# Patient Record
Sex: Female | Born: 1990 | Race: Black or African American | Hispanic: No | Marital: Single
Health system: Southern US, Community
[De-identification: ages and names within clinical notes are randomized; demographics above are authoritative.]

---

## 2021-10-24 ENCOUNTER — Emergency Department: Payer: BC Managed Care – PPO

## 2021-10-24 ENCOUNTER — Emergency Department
Admission: EM | Admit: 2021-10-24 | Discharge: 2021-10-24 | Disposition: A | Discharge status: Home / Self Care | Payer: BC Managed Care – PPO | Attending: Emergency Medicine | Admitting: Emergency Medicine

## 2021-10-24 ENCOUNTER — Encounter: Payer: Self-pay | Admitting: Emergency Medicine

## 2021-10-24 ENCOUNTER — Other Ambulatory Visit: Payer: Self-pay

## 2021-10-24 DIAGNOSIS — M7989 Other specified soft tissue disorders: Secondary | ICD-10-CM | POA: Insufficient documentation

## 2021-10-24 DIAGNOSIS — M79644 Pain in right finger(s): Secondary | ICD-10-CM | POA: Diagnosis not present

## 2021-10-24 DIAGNOSIS — M79641 Pain in right hand: Secondary | ICD-10-CM | POA: Diagnosis not present

## 2021-10-24 MED ORDER — CEPHALEXIN 500 MG PO CAPS: 500.0000 mg | ORAL_CAPSULE | Freq: Four times a day (QID) | ORAL | 0 refills | Status: AC

## 2021-10-24 NOTE — ED Triage Notes (Signed)
Pt reports pain to her right hand for the past 2 days, denies obvious injuries. ?

## 2021-10-24 NOTE — ED Provider Notes (Signed)
? ?[facility] ?Provider Note ? ? ? Event Date/Time  ? First MD Initiated Contact with Patient 10/24/21 1039   ?  (approximate) ? ? ?History  ? ?Chief Complaint ?Hand Pain ? ? ?HPI ?Katherine Williamson is a 31 y.o. female, no remarkable medical history, presents to the emergency department for evaluation of right hand pain.  Patient states that she has been experiencing mild pain diffusely around her right hand, primarily along her ring finger.  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  She states that her right hand is more swollen than her left.  Denies fever/chills, chest pain, shortness of breath, abdominal pain, back pain, numbness/tingling in upper extremities, urinary symptoms, headache, fatigue, lightheadedness/dizziness, or nausea/vomiting. ? ?History Limitations: No limitations. ? ?  ? ? ?Physical Exam  ?Triage Vital Signs: ?ED Triage Vitals [10/24/21 1037]  ?Enc Vitals Group  ?   BP   ?   Pulse   ?   Resp   ?   Temp   ?   Temp src   ?   SpO2   ?   Weight 217 lb (98.4 kg)  ?   Height 5\' 9"  (1.753 m)  ?   Head Circumference   ?   Peak Flow   ?   Pain Score 10  ?   Pain Loc   ?   Pain Edu?   ?   Excl. in GC?   ? ? ?Most recent vital signs: ?Vitals:  ? 10/24/21 1041  ?BP: (!) 136/105  ?Pulse: 100  ?Resp: 18  ?Temp: 98.4 ?F (36.9 ?C)  ?SpO2: 96%  ? ? ?General: Awake, NAD.  ?Skin: Warm, dry.  ?CV: Good peripheral perfusion.  ?Resp: Normal effort.  ?Abd: Soft, non-tender. No distention.  ?Neuro: At baseline. No gross neurological deficits.  ?Other: Right hand is mildly more swollen than the left.  Patient maintains normal grip strength and flexion/extension in the phalanges, however the range of motion in the ring finger is slightly more reduced due to pain.  Reports diffuse tenderness around the affected finger.  There is mild erythema present.  No active bleeding or discharge.  Normal cap refill distally. ? ?Physical  Exam ? ? ? ?ED Results / Procedures / Treatments  ?Labs ?(all labs ordered are listed, but only abnormal results are displayed) ?Labs Reviewed - No data to display ? ? ?EKG ?Not applicable. ? ? ?RADIOLOGY ? ?ED Provider Interpretation: I personally reviewed and interpreted this x-ray, no evidence of acute findings. ? ?DG Hand Complete Right ? ?Result Date: 10/24/2021 ?CLINICAL DATA:  Right hand pain for 2 days. EXAM: RIGHT HAND - COMPLETE 3+ VIEW COMPARISON:  None. FINDINGS: Three view study. There is no evidence of fracture or dislocation. There is no evidence of arthropathy or other focal bone abnormality. Soft tissues are unremarkable. IMPRESSION: Negative. Electronically Signed   By: 12/24/2021 M.D.   On: 10/24/2021 11:32   ? ?PROCEDURES: ? ?Critical Care performed: None. ? ?Procedures ? ? ? ?MEDICATIONS ORDERED IN ED: ?Medications - No data to display ? ? ?IMPRESSION / MDM / ASSESSMENT AND PLAN / ED COURSE  ?I reviewed the triage vital signs and the nursing notes. ?             ?               ? ? ?Differential diagnosis includes, but is not limited  to, metacarpal fracture, phalangeal fracture, mallet finger, [place] finger, cellulitis, flexor tenosynovitis ? ?ED Course ?Patient appears well.  Vital signs within normal limits.  NAD ? ?An x-ray shows no acute abnormalities. ? ?Assessment/Plan ?Patient presents with hand pain/swelling, particularly prominent along the right ring finger.  X-ray reassuring for no evidence of fractures or dislocations.  Patient's  limited range of motion possibly due to soft tissue injury of the flexor/extensor tendons, however unlikely due to any identifiable traumatic incident.  The hand does not look obviously infected, however the ring finger does have mild swelling and faint erythema.  Presentation not consistent with flexor tenosynovitis, but may potentially have early cellulitis.  Will provide patient with a splint and a prescription for cephalexin.  Advised her to follow-up  with her primary care provider within the week for further evaluation..  Patient expressed understanding and agreed with the plan.  Will discharge. ? ?Patient was provided with anticipatory guidance, return precautions, and educational material. Encouraged the patient to return to the emergency department at any time if they begin to experience any new or worsening symptoms.  ? ?  ? ? ?FINAL CLINICAL IMPRESSION(S) / ED DIAGNOSES  ? ?Final diagnoses:  ?Hand pain, right  ? ? ? ?Rx / DC Orders  ? ?ED Discharge Orders   ? ?      Ordered  ?  cephALEXin (KEFLEX) 500 MG capsule  4 times daily       ? 10/24/21 1211  ? ?  ?  ? ?  ? ? ? ?Note:  This document was prepared using Dragon voice recognition software and may include unintentional dictation errors. ?  ?Varney Daily, [place] ?10/25/21 2585 ? ?  ?Georga Hacking, MD ?10/25/21 1622 ? ?

## 2021-10-24 NOTE — Discharge Instructions (Addendum)
-  Take all your antibiotics as prescribed. ?-You may treat the pain with Tylenol/ibuprofen as needed. ?-Return to the emergency department anytime if you begin to experience any new or worsening symptoms. ? ?

## 2023-04-05 IMAGING — DX DG HAND COMPLETE 3+V*R*
3 series · 3 of 3 positions shown · non-contrast
Comparison: None.

CLINICAL DATA: Right hand pain for 2 days.

EXAM:
RIGHT HAND - COMPLETE 3+ VIEW

[hand ap]
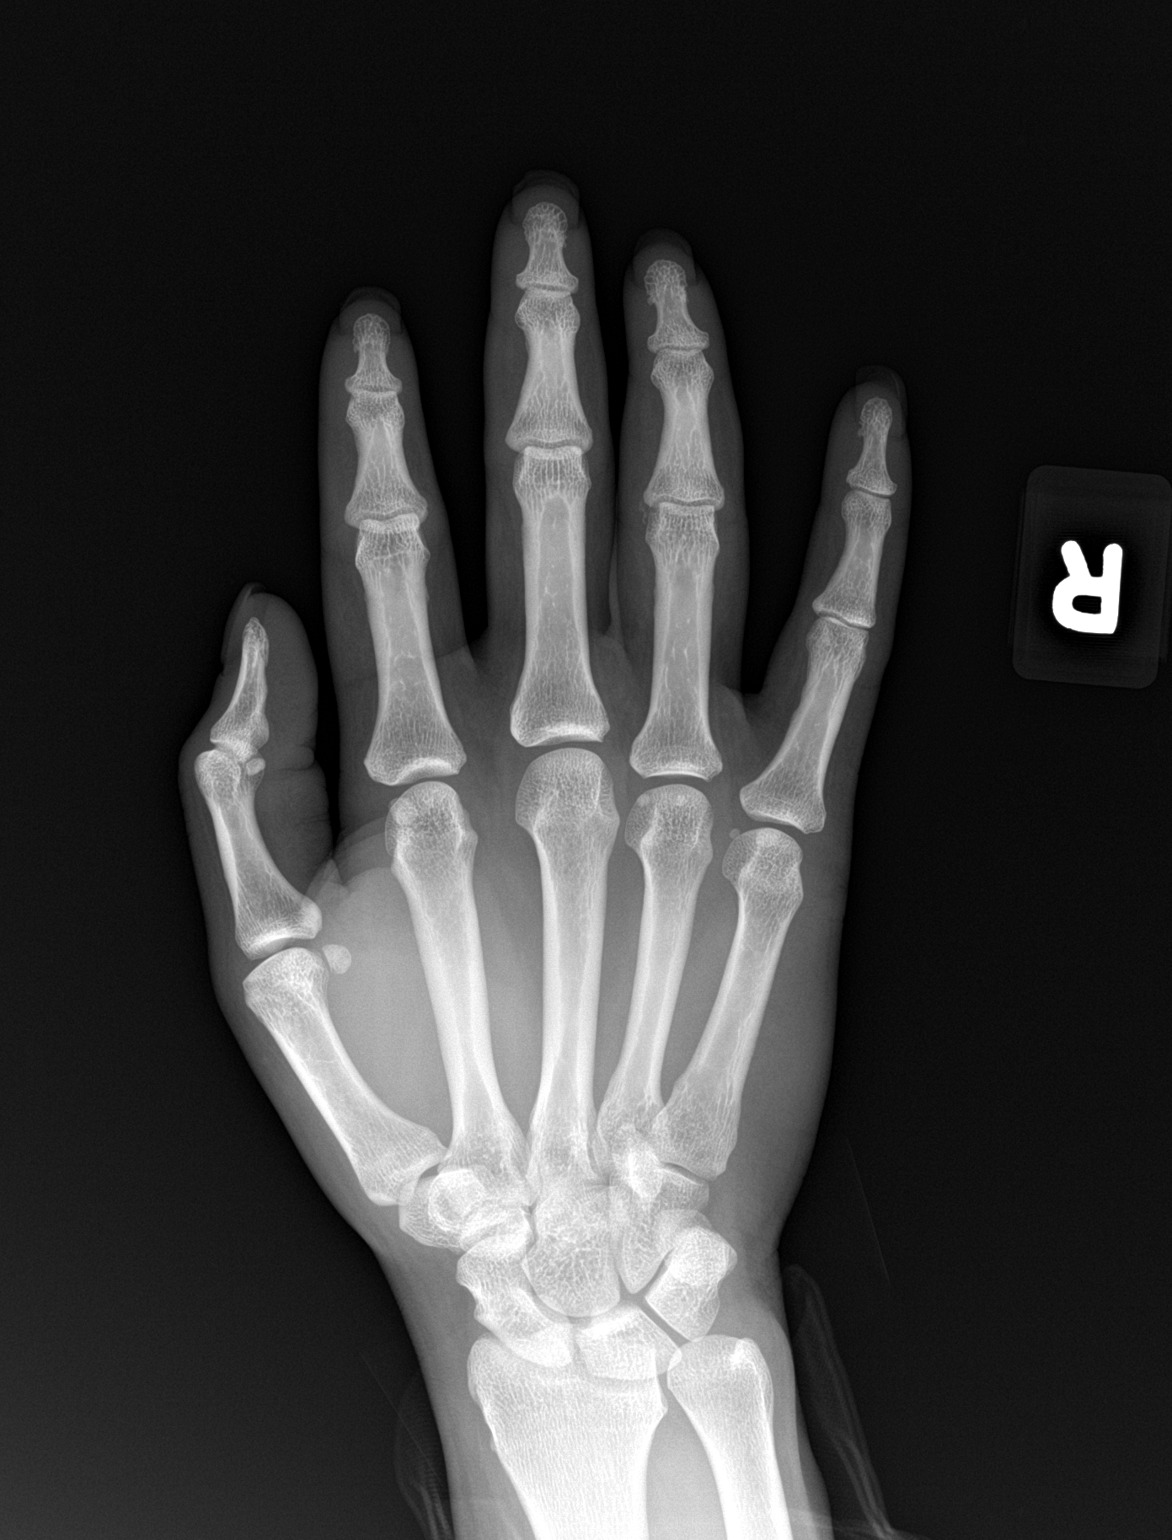

[hand obl]
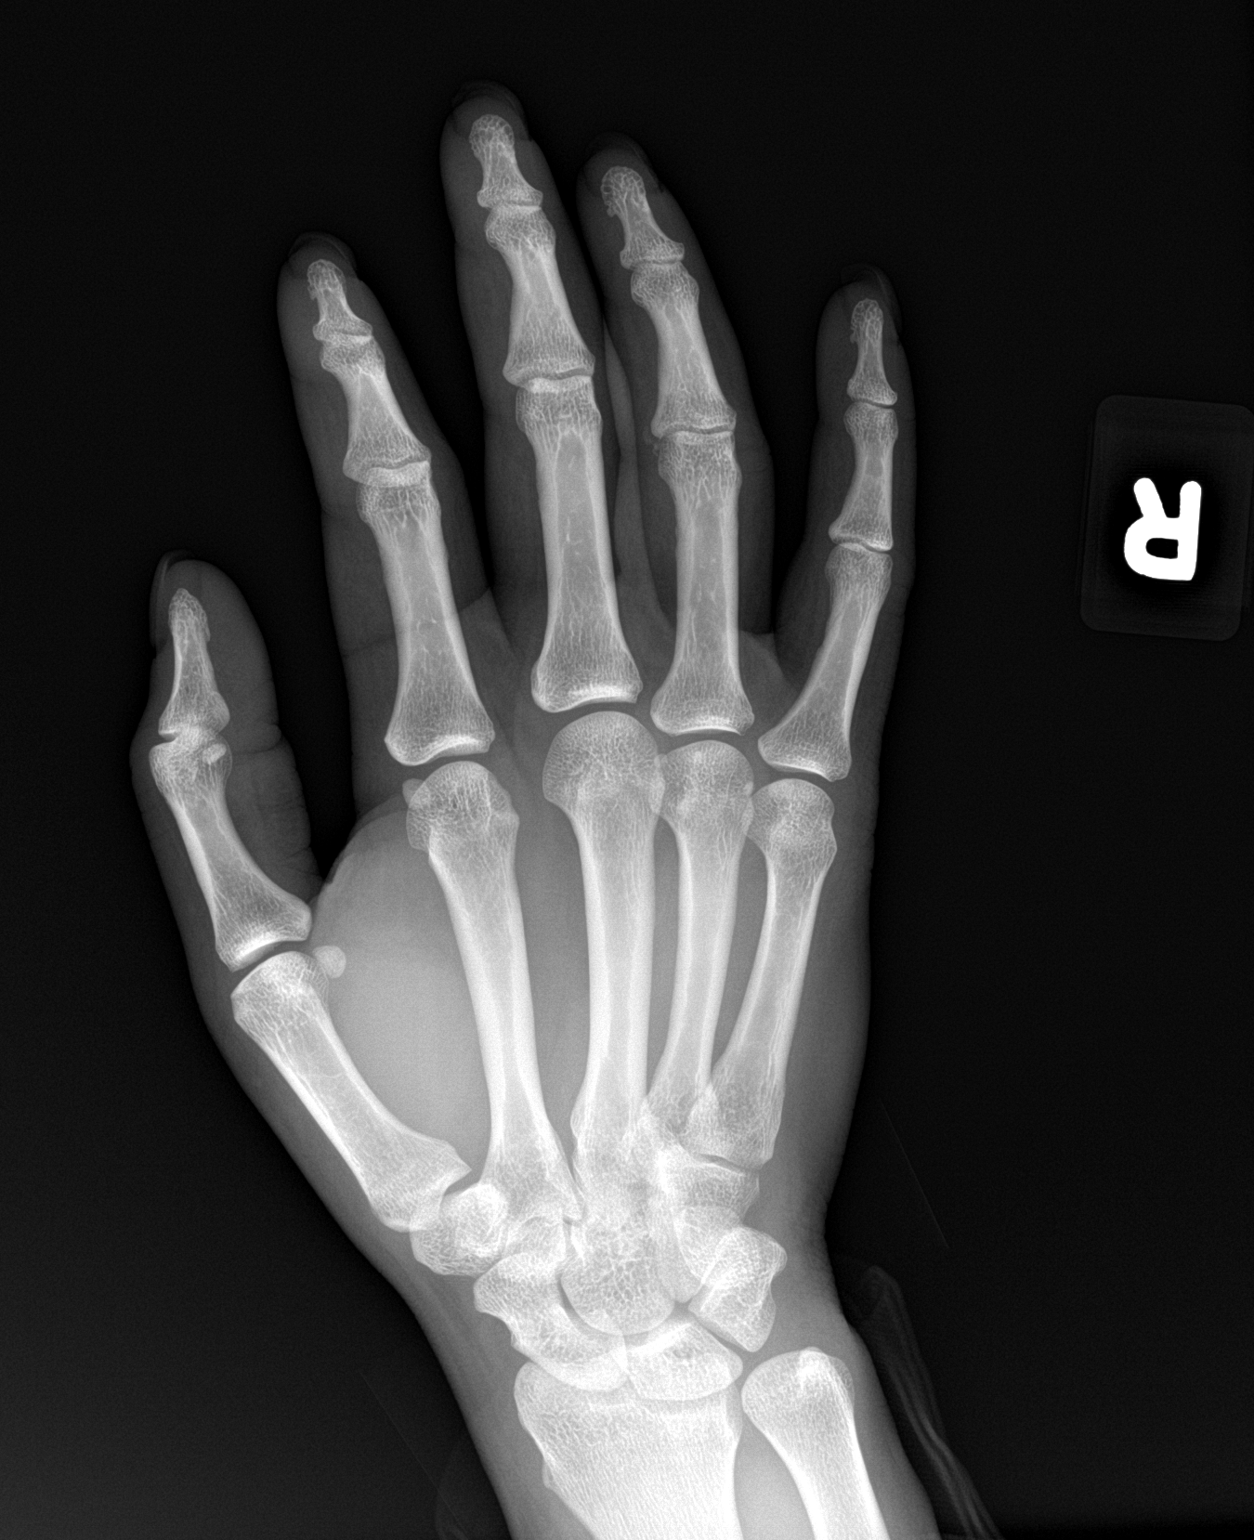

[hand lat]
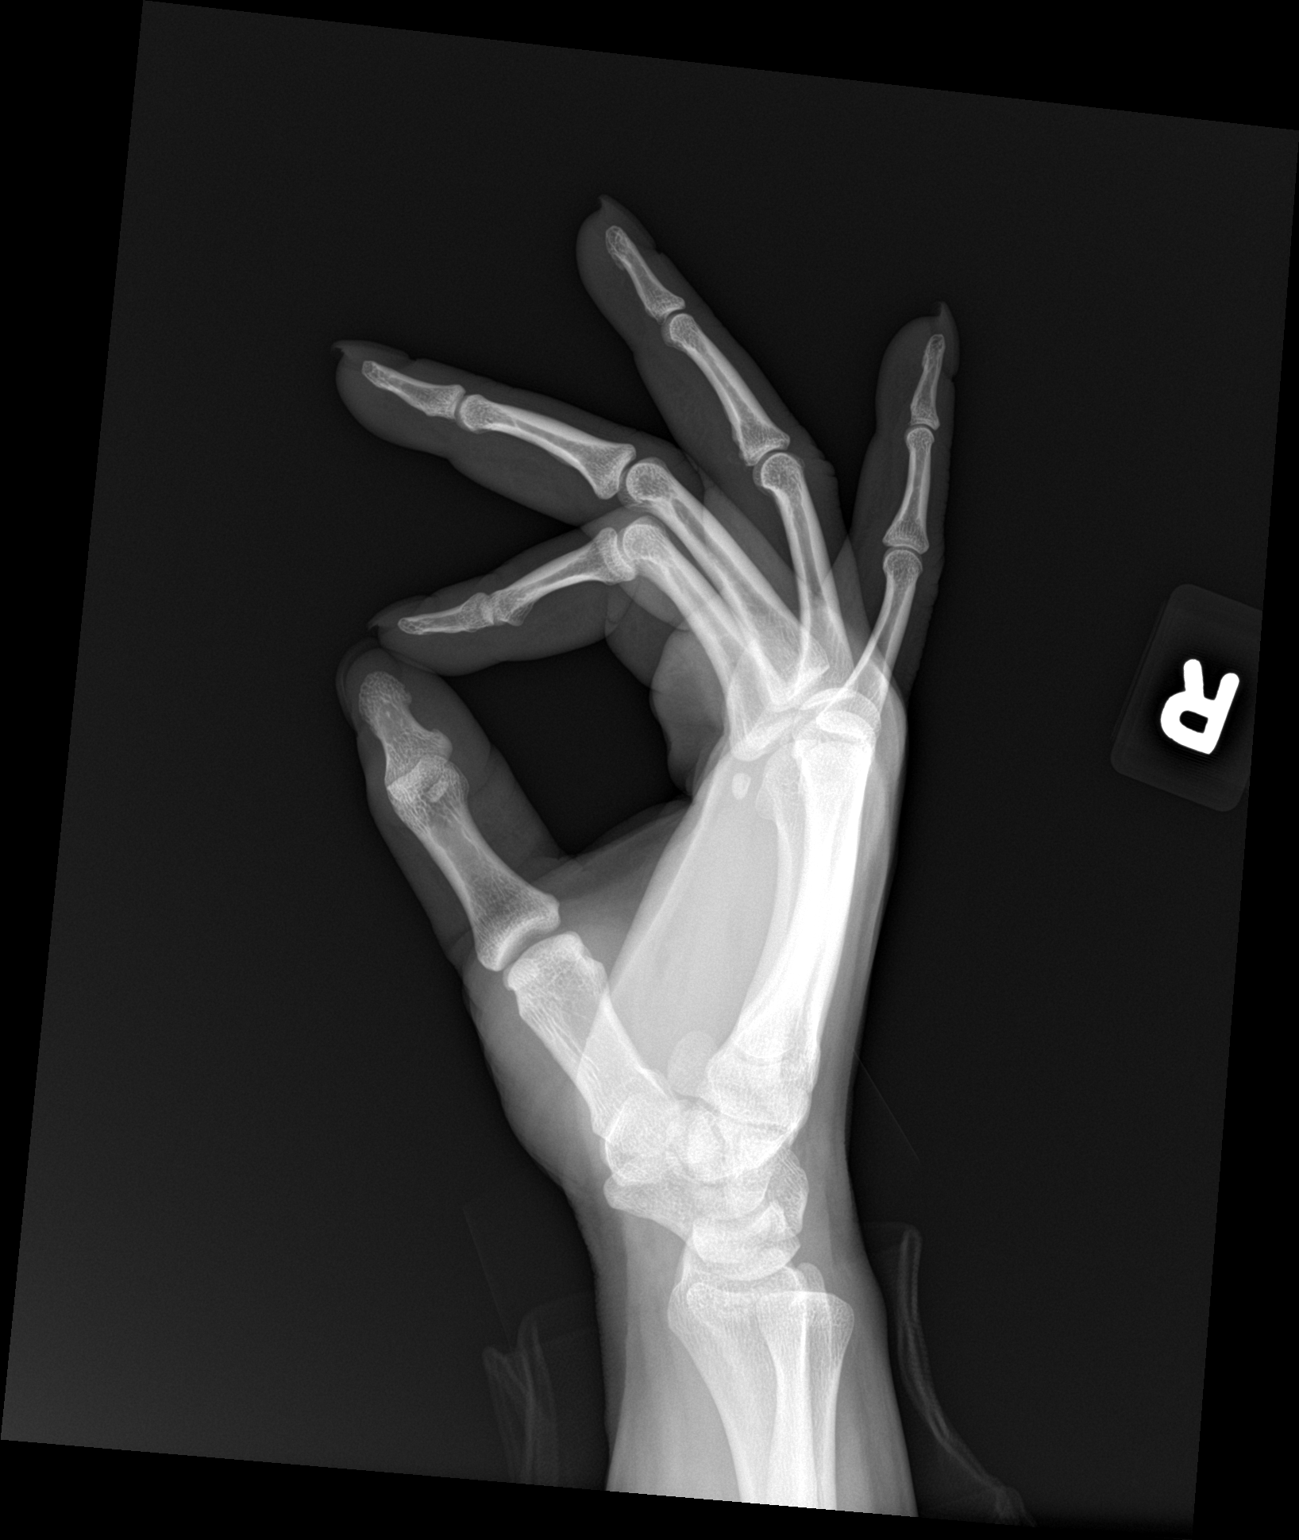

[3 of 3 positions shown; findings below may reference images not displayed]

FINDINGS: Three view study. There is no evidence of fracture or dislocation.
There is no evidence of arthropathy or other focal bone abnormality.
Soft tissues are unremarkable.
IMPRESSION: Negative.

## 2023-05-15 ENCOUNTER — Telehealth: Payer: Self-pay

## 2023-05-15 NOTE — Telephone Encounter (Signed)
Copied from CRM [id number]. Topic: Appointment Scheduling - Scheduling Inquiry for Clinic >> May 15, 2023  9:15 AM Phill Myron wrote: Katherine Williamson would like information on Dr [facility]. Please advise

## 2023-05-16 ENCOUNTER — Encounter: Payer: BC Managed Care – PPO | Admitting: Family Medicine

## 2023-08-02 ENCOUNTER — Ambulatory Visit: Payer: BC Managed Care – PPO | Admitting: Family Medicine

## 2023-09-14 ENCOUNTER — Ambulatory Visit: Payer: Self-pay | Admitting: Family Medicine

## 2024-01-16 ENCOUNTER — Ambulatory Visit (INDEPENDENT_AMBULATORY_CARE_PROVIDER_SITE_OTHER)

## 2024-01-16 ENCOUNTER — Ambulatory Visit (HOSPITAL_COMMUNITY)
Admission: EM | Admit: 2024-01-16 | Discharge: 2024-01-16 | Disposition: A | Discharge status: Home / Self Care | Attending: Emergency Medicine | Admitting: Emergency Medicine

## 2024-01-16 ENCOUNTER — Encounter (HOSPITAL_COMMUNITY): Payer: Self-pay

## 2024-01-16 DIAGNOSIS — M25552 Pain in left hip: Secondary | ICD-10-CM

## 2024-01-16 MED ORDER — CYCLOBENZAPRINE HCL 10 MG PO TABS: 10.0000 mg | ORAL_TABLET | Freq: Two times a day (BID) | ORAL | 0 refills | Status: AC | PRN

## 2024-01-16 MED ORDER — KETOROLAC TROMETHAMINE 30 MG/ML IJ SOLN
30.0000 mg | Freq: Once | INTRAMUSCULAR | Status: AC
  Administered 2024-01-16: 30 mg via INTRAMUSCULAR

## 2024-01-16 MED ORDER — PREDNISONE 20 MG PO TABS: 40.0000 mg | ORAL_TABLET | Freq: Every day | ORAL | 0 refills | Status: AC

## 2024-01-16 MED ORDER — KETOROLAC TROMETHAMINE 30 MG/ML IJ SOLN
INTRAMUSCULAR | Status: AC
Start: 2024-01-16 — End: 2024-01-16
  Filled 2024-01-16: qty 1

## 2024-01-16 NOTE — Discharge Instructions (Addendum)
 The xray does not show any abnormality of the pelvis  The Toradol injection given today should continue to provide pain relief overnight. Please do not use any NSAIDs (ibuprofen/Advil, naproxen/Aleve, etc) for the next 24 hours. You can safely use tylenol if needed.  You can take the muscle relaxer (FLEXERIL) twice daily. If the medication makes you drowsy, take only at bed time.  Starting tomorrow morning, start the prednisone burst. Take 2 tablets daily for 5 days in a row. This is a steroid for inflammation and pain  Please follow closely with orthopedics - you can call to make an appointment

## 2024-01-16 NOTE — ED Provider Notes (Signed)
 [facility]    CSN: [id number] Arrival date & time: 01/16/24  1749      History   Chief Complaint Chief Complaint  Patient presents with   Leg Pain    HPI Katherine Williamson is a 33 y.o. female.  2 day history of left hip and groin pain that radiates down the left thigh. Today rating 10/10 pain Has used tylenol and BC powder. Also tried one gabapentin last night Denies injury or trauma. No falls  In February saw [facility]. 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 MRI was done in March but she didn't get the results. Reports [facility] is very pricey and hasn't been back to see them.   History reviewed. No pertinent past medical history.  There are no active problems to display for this patient.   History reviewed. No pertinent surgical history.  OB History   No obstetric history on file.      Home Medications    Prior to Admission medications   Medication Sig Start Date End Date Taking? Authorizing Provider  cyclobenzaprine (FLEXERIL) 10 MG tablet Take 1 tablet (10 mg total) by mouth 2 (two) times daily as needed for muscle spasms. 01/16/24  Yes Katherine Williamson, Asberry, Katherine Williamson  predniSONE (DELTASONE) 20 MG tablet Take 2 tablets (40 mg total) by mouth daily with breakfast for 5 days. 01/17/24 01/22/24 Yes Carrolyn Hilmes, Katherine Williamson    Family History History reviewed. No pertinent family history.  Social History Social History   Tobacco Use   Smoking status: Never    Passive exposure: Never   Smokeless tobacco: Never  Vaping Use   Vaping status: Never Used  Substance Use Topics   Alcohol use: Not Currently   Drug use: Never     Allergies   Patient has no known allergies.   Review of Systems Review of Systems As per HPI  Physical Exam Triage Vital Signs ED Triage Vitals  Encounter Vitals Group     BP 01/16/24 1819 119/65     Girls Systolic BP Percentile --      Girls Diastolic BP Percentile --      Boys Systolic BP Percentile --      Boys Diastolic BP  Percentile --      Pulse Rate 01/16/24 1819 (!) 110     Resp 01/16/24 1819 18     Temp 01/16/24 1819 99.6 F (37.6 C)     Temp Source 01/16/24 1819 Oral     SpO2 01/16/24 1819 96 %     Weight --      Height --      Head Circumference --      Peak Flow --      Pain Score 01/16/24 1820 10     Pain Loc --      Pain Education --      Exclude from Growth Chart --    No data found.  Updated Vital Signs BP 110/75 (BP Location: Right Arm)   Pulse (!) 101   Temp 99.3 F (37.4 C) (Oral)   Resp 18   LMP 01/08/2024 (Exact Date)   SpO2 98%    Physical Exam Vitals and nursing note reviewed.  Constitutional:      General: She is not in acute distress.    Appearance: She is not ill-appearing.  HENT:     Mouth/Throat:     Pharynx: Oropharynx is clear.   Cardiovascular:     Rate and Rhythm: Normal rate and regular rhythm.  Pulses: Normal pulses.  Pulmonary:     Effort: Pulmonary effort is normal.   Musculoskeletal:        General: No deformity.     Cervical back: Normal range of motion. No rigidity.     Left lower leg: No edema.     Comments: Reduced ROM of left lower extremity at hip due to pain. No bony tenderness of C-L spine. Right leg strength 5/5, left leg 4/5. Distal sensation is intact. Normal ROM of ankles and knees, DP pulses 2+. Negative straight leg test  Lymphadenopathy:     Cervical: No cervical adenopathy.   Skin:    Capillary Refill: Capillary refill takes less than 2 seconds.   Neurological:     Mental Status: She is alert and oriented to person, place, and time.     UC Treatments / Results  Labs (all labs ordered are listed, but only abnormal results are displayed) Labs Reviewed - No data to display  EKG  Radiology DG Hip Unilat With Pelvis 2-3 Views Left Result Date: 01/16/2024 CLINICAL DATA:  221992 Left hip pain 221992 EXAM: DG HIP (WITH OR WITHOUT PELVIS) 2-3V LEFT COMPARISON:  None Available. FINDINGS: There is no evidence of hip fracture or  dislocation. There is no evidence of arthropathy or other focal bone abnormality. IMPRESSION: Negative. Electronically Signed   By: JONETTA Faes M.D.   On: 01/16/2024 19:15    Procedures Procedures  Medications Ordered in UC Medications  ketorolac (TORADOL) 30 MG/ML injection 30 mg (30 mg Intramuscular Given 01/16/24 1903)    Initial Impression / Assessment and Plan / UC Course  I have reviewed the triage vital signs and the nursing notes.  Pertinent labs & imaging results that were available during my care of the patient were reviewed by me and considered in my medical decision making (see chart for details).  Left hip/pelvis xray is negative. Images independently reviewed by me, agree with radiology interpretation. IM toradol given in clinic for pain with improvement  Prednisone burst x 5 days starting tomorrow morning Flexeril BID with drowsy precautions  Close follow with orthopedics recommended ED precautions  Final Clinical Impressions(s) / UC Diagnoses   Final diagnoses:  Left hip pain     Discharge Instructions      The xray does not show any abnormality of the pelvis  The Toradol injection given today should continue to provide pain relief overnight. Please do not use any NSAIDs (ibuprofen/Advil, naproxen/Aleve, etc) for the next 24 hours. You can safely use tylenol if needed.  You can take the muscle relaxer (FLEXERIL) twice daily. If the medication makes you drowsy, take only at bed time.  Starting tomorrow morning, start the prednisone burst. Take 2 tablets daily for 5 days in a row. This is a steroid for inflammation and pain  Please follow closely with orthopedics - you can call to make an appointment      ED Prescriptions     Medication Sig Dispense Auth. Provider   predniSONE (DELTASONE) 20 MG tablet Take 2 tablets (40 mg total) by mouth daily with breakfast for 5 days. 10 tablet Katherine Romero, Katherine Williamson   cyclobenzaprine (FLEXERIL) 10 MG tablet Take 1  tablet (10 mg total) by mouth 2 (two) times daily as needed for muscle spasms. 20 tablet Katherine Williamson, Asberry, Katherine Williamson      PDMP not reviewed this encounter.   Jeryl Katherine, [place] 01/16/24 1948

## 2024-01-16 NOTE — ED Triage Notes (Signed)
 Pt c/o pain from lt groin/hip pain shooting down lt leg x2 days. Took OTC meds with no relief. Denies injury.

## 2024-01-18 ENCOUNTER — Other Ambulatory Visit: Payer: Self-pay

## 2024-01-18 ENCOUNTER — Emergency Department (HOSPITAL_BASED_OUTPATIENT_CLINIC_OR_DEPARTMENT_OTHER)
Admission: EM | Admit: 2024-01-18 | Discharge: 2024-01-18 | Disposition: A | Discharge status: Home / Self Care | Attending: Emergency Medicine | Admitting: Emergency Medicine

## 2024-01-18 DIAGNOSIS — R1032 Left lower quadrant pain: Secondary | ICD-10-CM | POA: Diagnosis present

## 2024-01-18 MED ORDER — HYDROCODONE-ACETAMINOPHEN 5-325 MG PO TABS: 1.0000 | ORAL_TABLET | Freq: Four times a day (QID) | ORAL | 0 refills | Status: AC | PRN

## 2024-01-18 NOTE — ED Provider Notes (Signed)
   EMERGENCY DEPARTMENT AT [facility] Provider Note   CSN: [id number] Arrival date & time: 01/18/24  2207     Patient presents with: Groin Pain   Katherine Williamson is a 33 y.o. female.   Patient is a 33 year old female presenting with complaints of left groin pain.  The symptoms began 4 days ago in the absence of any injury or trauma.  She describes severe pain to the left groin that is worse when she attempts to move or ambulate.  She denies any weakness or numbness.  She was seen at urgent care 2 days ago and prescribed prednisone and Flexeril, however neither of these are helping at all.  She rates her pain as a 10 out of 10 and is the worst pain she has ever felt.       Prior to Admission medications   Medication Sig Start Date End Date Taking? Authorizing Provider  cyclobenzaprine (FLEXERIL) 10 MG tablet Take 1 tablet (10 mg total) by mouth 2 (two) times daily as needed for muscle spasms. 01/16/24   Rising, [place], PA-C  predniSONE (DELTASONE) 20 MG tablet Take 2 tablets (40 mg total) by mouth daily with breakfast for 5 days. 01/17/24 01/22/24  Rising, [place], PA-C    Allergies: Patient has no known allergies.    Review of Systems  All other systems reviewed and are negative.   Updated Vital Signs BP 130/84 (BP Location: Right Arm)   Pulse 90   Temp 98 F (36.7 C)   Resp 17   LMP 01/08/2024 (Exact Date)   SpO2 99%   Physical Exam Vitals and nursing note reviewed.  Constitutional:      Appearance: Normal appearance.  Pulmonary:     Effort: Pulmonary effort is normal.   Musculoskeletal:     Comments: The left inguinal region appears.  There is no swelling, no erythema.  She does have pain with flexion or external rotation of the hip.  DP pulses are palpable and motor and sensation are intact throughout the entire foot.   Skin:    General: Skin is warm and dry.   Neurological:     Mental Status: She is alert and oriented to person, place, and  time.     (all labs ordered are listed, but only abnormal results are displayed) Labs Reviewed - No data to display  EKG: None  Radiology: No results found.   Procedures   Medications Ordered in the ED - No data to display                                  Medical Decision Making  Patient presenting with left groin pain as described in the HPI.  I suspect some sort of hip flexor injury or inflammatory process.  She has been taking prednisone for the past 2-1/2 days with no relief and Flexeril with no relief.  She rates her pain as a 10 out of 10 and I feel as though a short course of pain medication is appropriate.  I will have her continue the prednisone and follow-up with primary doctor if not improving.  Perhaps physical therapy or an MRI would be appropriate if not improving in a reasonable amount of time.     Final diagnoses:  None    ED Discharge Orders     None          Geroldine Berg, MD 01/18/24 2329

## 2024-01-18 NOTE — ED Triage Notes (Signed)
 Patient to ED reporting left sided groin pain since Sunday, seen at [facility], Xray performed, Toradol given IM and a prescription for prednisone and cyclobenzaprine. Patient has been taking medications without improvement.   No back pain and no abd pain reported. No known injury.

## 2024-01-18 NOTE — Discharge Instructions (Signed)
 Continue taking prednisone as previously prescribed.  Begin taking hydrocodone as prescribed as needed for pain.  Do not drive or operate heavy machinery while taking this medication because it will make you drowsy.  Follow-up with your primary doctor if symptoms are not improving in the next week.

## 2024-01-24 ENCOUNTER — Ambulatory Visit: Admitting: Family Medicine
